# Patient Record
Sex: Female | Born: 1995 | Race: White | Hispanic: No | Marital: Single | Smoking: Never smoker
Health system: Southern US, Community
[De-identification: ages and names within clinical notes are randomized; demographics above are authoritative.]

## PROBLEM LIST (undated history)

## (undated) DIAGNOSIS — G43909 Migraine, unspecified, not intractable, without status migrainosus: Secondary | ICD-10-CM

## (undated) DIAGNOSIS — J45909 Unspecified asthma, uncomplicated: Secondary | ICD-10-CM

## (undated) DIAGNOSIS — K900 Celiac disease: Secondary | ICD-10-CM

## (undated) HISTORY — PX: WISDOM TOOTH EXTRACTION: SHX21

---
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
## 2017-04-25 IMAGING — CT CT HEAD W/O CM
1 series · 16 of 30 positions shown, 20 images · non-contrast
Comparison: None.

CLINICAL DATA: Headache.  History of migraines

EXAM:
CT HEAD WITHOUT CONTRAST
TECHNIQUE: Contiguous axial images were obtained from the base of the skull
through the vertex without intravenous contrast.

[Series 2: head wo · axial · 0.38mm/px · z∈[+326,+452]mm · 16 of 32 slices shown, 20 images]
[im 2/32  brain]
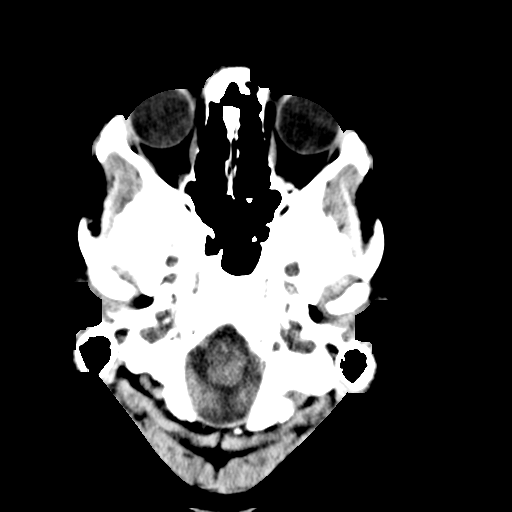
[im 2/32  bone]
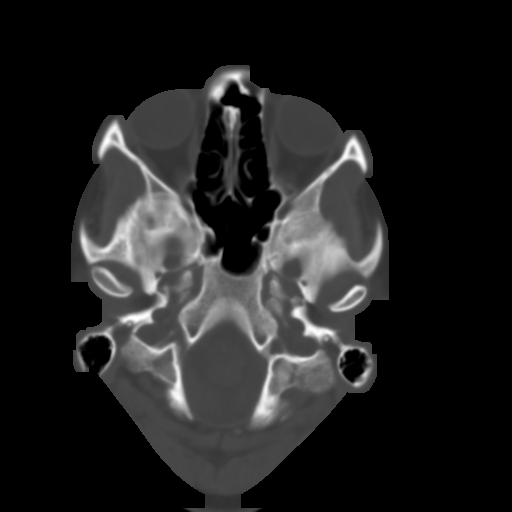
[im 4/32  brain]
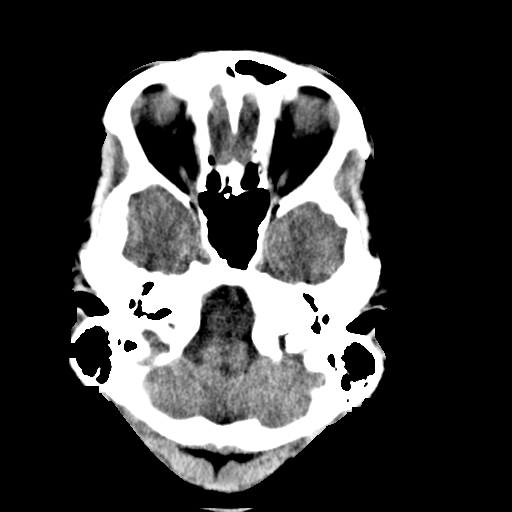
[im 6/32  brain]
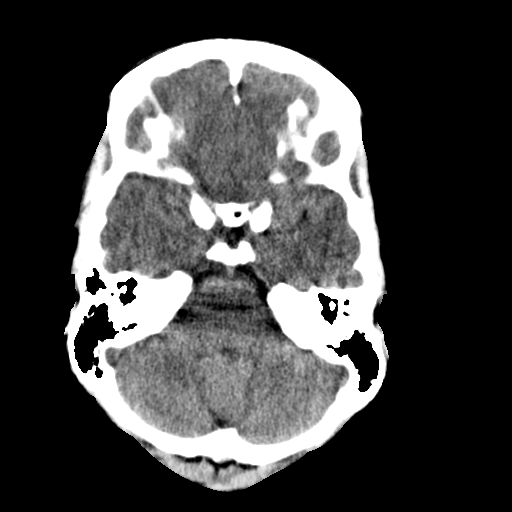
[im 8/32  brain]
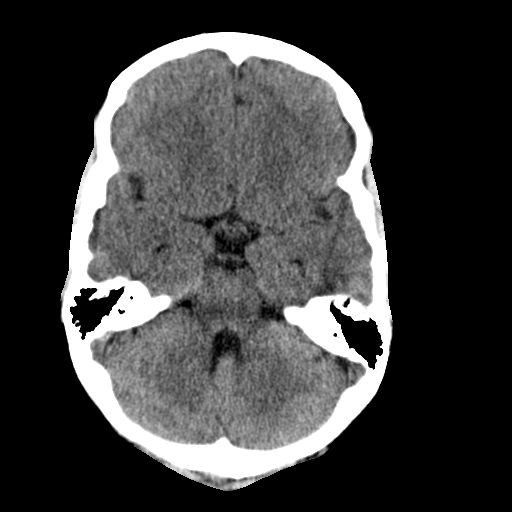
[im 9/32  brain]
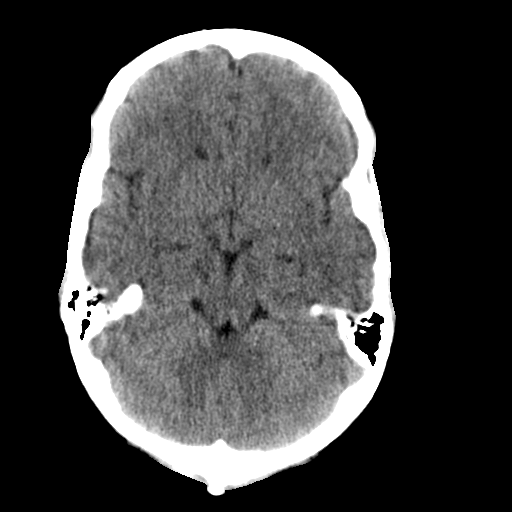
[im 9/32  bone]
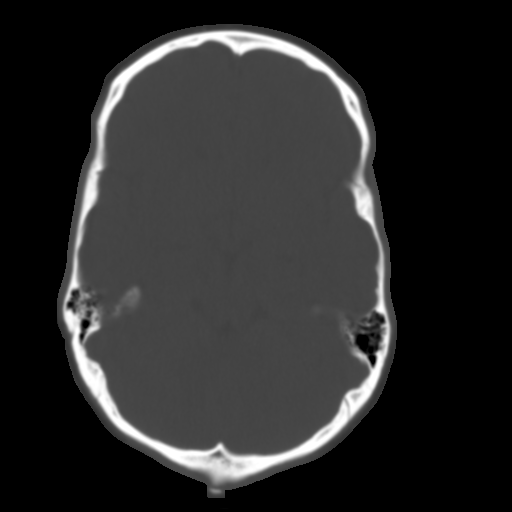
[im 11/32  brain]
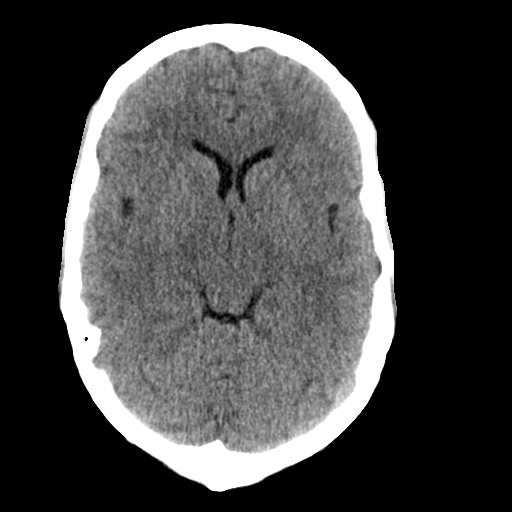
[im 13/32  brain]
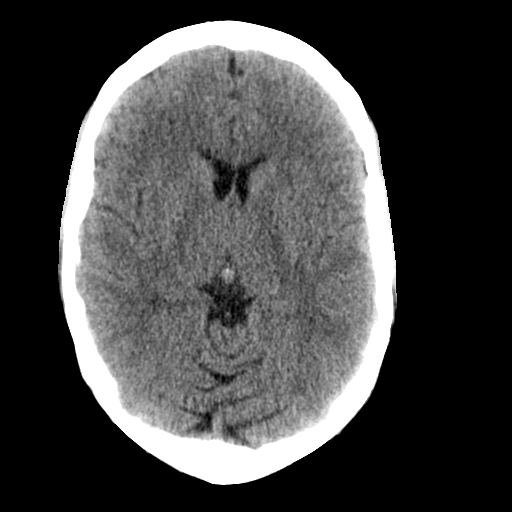
[im 15/32  brain]
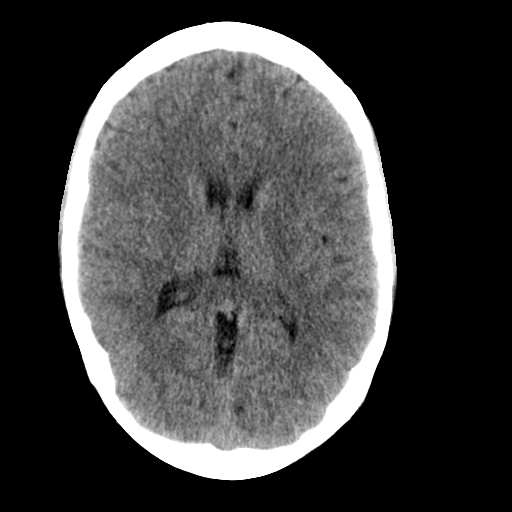
[im 17/32  brain]
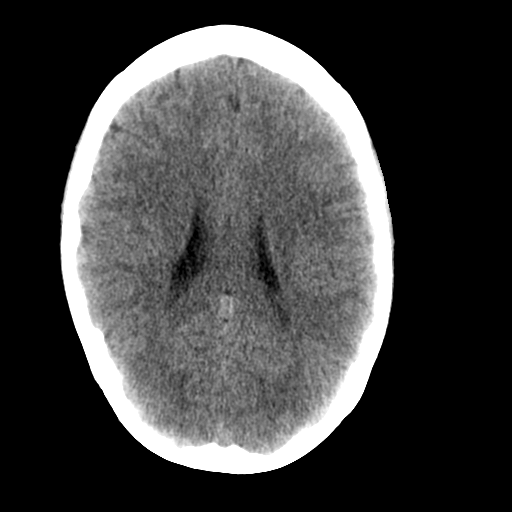
[im 17/32  bone]
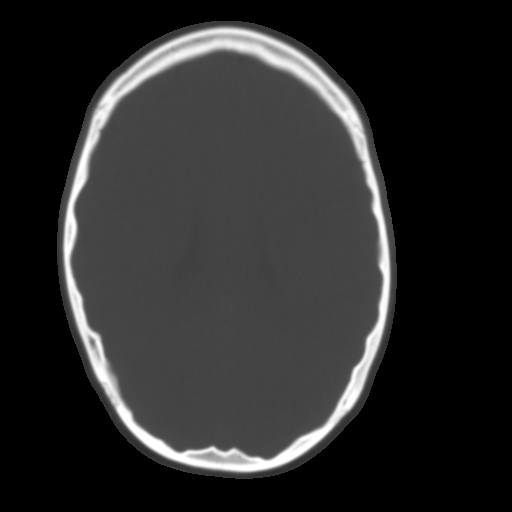
[im 19/32  brain]
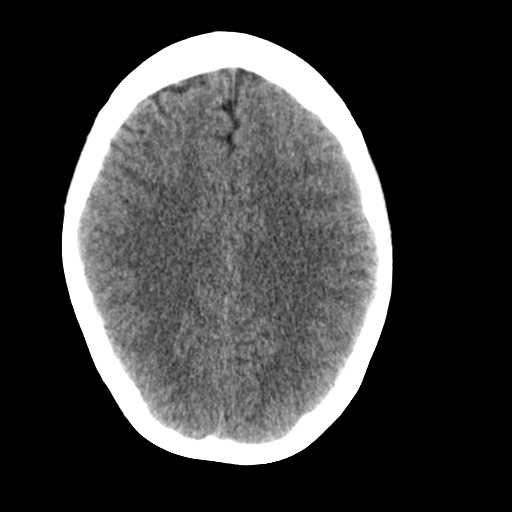
[im 21/32  brain]
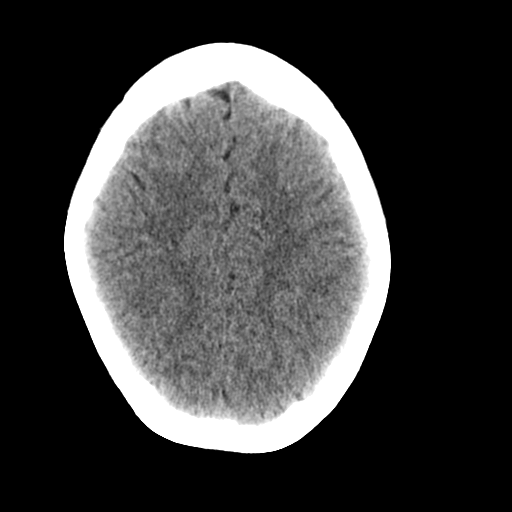
[im 23/32  brain]
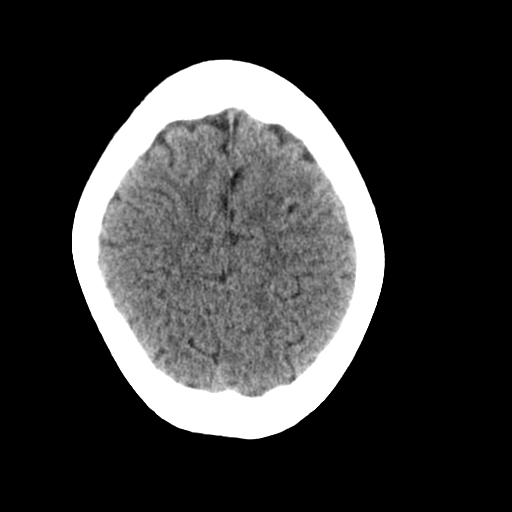
[im 24/32  brain]
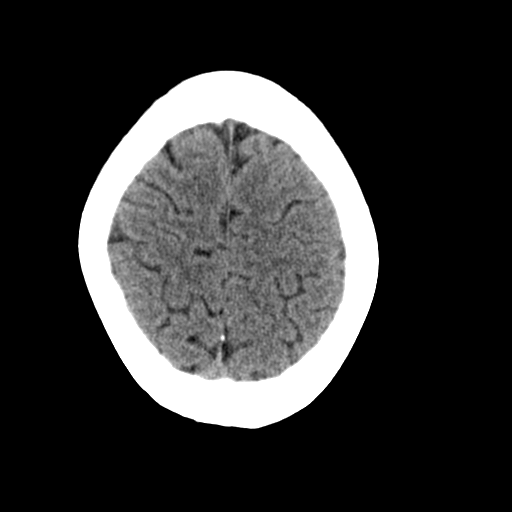
[im 24/32  bone]
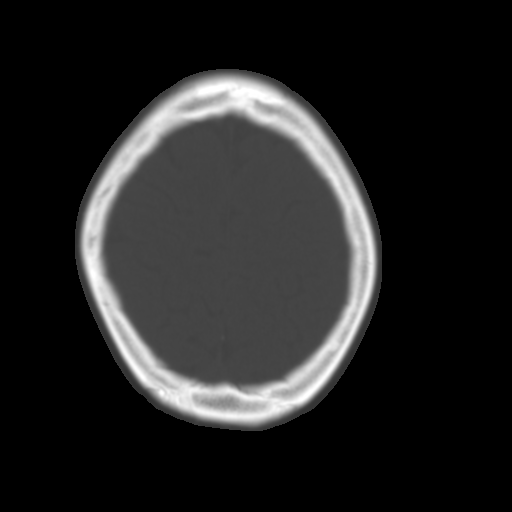
[im 26/32  brain]
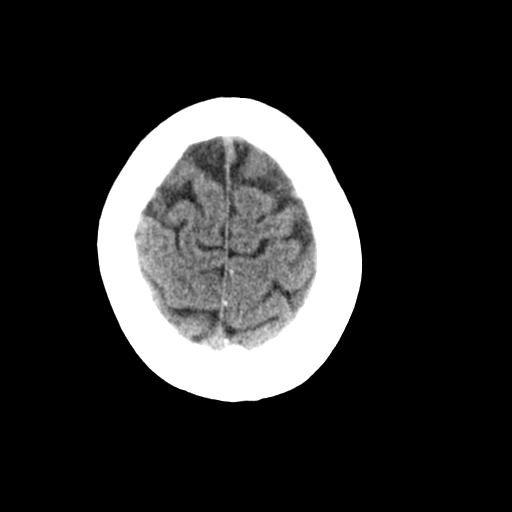
[im 28/32  brain]
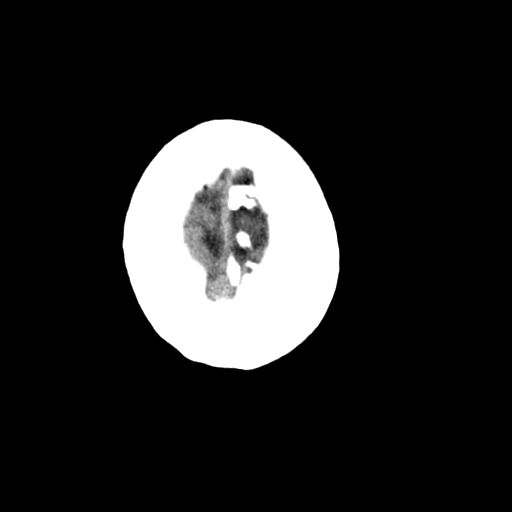
[im 30/32  brain]
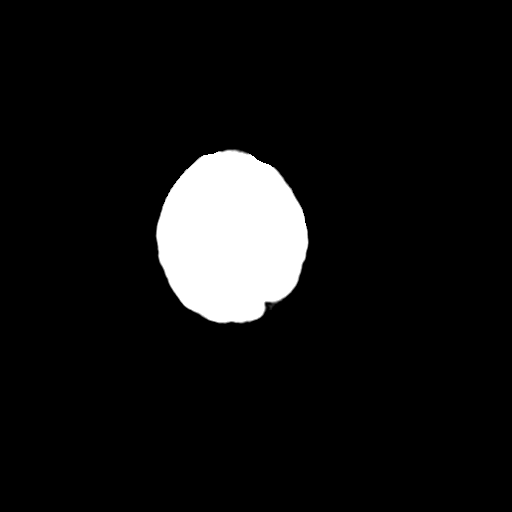

[16 of 30 positions shown; findings below may reference images not displayed]

FINDINGS: Skull and Sinuses:Negative for fracture or destructive process. The
visualized mastoids, middle ears, and imaged paranasal sinuses are
clear.

Visualized orbits: Negative.

Brain: No acute or remote infarction, hemorrhage, hydrocephalus, or
mass lesion/mass effect.
IMPRESSION: Negative study.  No explanation for headache.
